# Patient Record
Sex: Male | Born: 1985 | Race: White | Hispanic: No | Marital: Single | Smoking: Former smoker
Health system: Southern US, Community
[De-identification: ages and names within clinical notes are randomized; demographics above are authoritative.]

---

## 2017-06-03 ENCOUNTER — Emergency Department (HOSPITAL_COMMUNITY): Payer: Self-pay

## 2017-06-03 ENCOUNTER — Emergency Department (HOSPITAL_COMMUNITY)
Admission: EM | Admit: 2017-06-03 | Discharge: 2017-06-03 | Disposition: A | Discharge status: Home / Self Care | Payer: Self-pay | Attending: Emergency Medicine | Admitting: Emergency Medicine

## 2017-06-03 ENCOUNTER — Encounter (HOSPITAL_COMMUNITY): Payer: Self-pay | Admitting: Emergency Medicine

## 2017-06-03 DIAGNOSIS — J209 Acute bronchitis, unspecified: Secondary | ICD-10-CM | POA: Insufficient documentation

## 2017-06-03 DIAGNOSIS — F17210 Nicotine dependence, cigarettes, uncomplicated: Secondary | ICD-10-CM | POA: Insufficient documentation

## 2017-06-03 DIAGNOSIS — J40 Bronchitis, not specified as acute or chronic: Secondary | ICD-10-CM

## 2017-06-03 LAB — INFLUENZA PANEL BY PCR (TYPE A & B)
INFLAPCR: NEGATIVE
INFLBPCR: NEGATIVE

## 2017-06-03 MED ORDER — IBUPROFEN 800 MG PO TABS
800.0000 mg | ORAL_TABLET | Freq: Once | ORAL | Status: AC
  Administered 2017-06-03: 800 mg via ORAL
  Filled 2017-06-03: qty 1

## 2017-06-03 MED ORDER — ONDANSETRON 4 MG PO TBDP
4.0000 mg | ORAL_TABLET | Freq: Once | ORAL | Status: AC
  Administered 2017-06-03: 4 mg via ORAL
  Filled 2017-06-03: qty 1

## 2017-06-03 MED ORDER — AZITHROMYCIN 250 MG PO TABS: 250.0000 mg | ORAL_TABLET | Freq: Every day | ORAL | 0 refills | Status: AC

## 2017-06-03 MED ORDER — BENZONATATE 100 MG PO CAPS: 100.0000 mg | ORAL_CAPSULE | Freq: Three times a day (TID) | ORAL | 0 refills | Status: AC

## 2017-06-03 NOTE — ED Provider Notes (Signed)
[facility]-EMERGENCY DEPT Provider Note   CSN: [id number] Arrival date & time: 06/03/17  [id number]     History   Chief Complaint Chief Complaint  Patient presents with  . Cough    HPI Jackson Harrington is a 31 y.o. male who presents to the ED with cough and generalized body aches that started at 3 am. Patient c/o left chest wall pain with coughing so hard. Patient reports chills and sweats, ? Fever. Patient is an every day smoker.   The history is provided by the patient. No language interpreter was used.  Cough  This is a new problem. The current episode started 6 to 12 hours ago. The problem occurs every few minutes. The problem has been gradually worsening. The cough is productive of sputum. Associated symptoms include chills, ear congestion, ear pain, headaches, sore throat, myalgias, shortness of breath and wheezing. Pertinent negatives include no eye redness. Chest pain: with cough. He is a smoker.    History reviewed. No pertinent past medical history.  There are no active problems to display for this patient.   History reviewed. No pertinent surgical history.     Home Medications    Prior to Admission medications   Medication Sig Start Date End Date Taking? Authorizing Provider  azithromycin (ZITHROMAX) 250 MG tablet Take 1 tablet (250 mg total) by mouth daily. Take first 2 tablets together, then 1 every day until finished. 06/03/17   Janne NapoleonNeese, Tichina Koebel M, NP  benzonatate (TESSALON) 100 MG capsule Take 1 capsule (100 mg total) by mouth every 8 (eight) hours. 06/03/17   Janne NapoleonNeese, Tasnia Spegal M, NP    Family History History reviewed. No pertinent family history.  Social History Social History   Tobacco Use  . Smoking status: Current Every Day Smoker  . Smokeless tobacco: Never Used  Substance Use Topics  . Alcohol use: No    Frequency: Never  . Drug use: Not on file     Allergies   Patient has no known allergies.   Review of Systems Review of Systems    Constitutional: Positive for chills. Fever: ?  HENT: Positive for congestion, ear pain and sore throat. Negative for sinus pain.   Eyes: Negative for discharge, redness and itching.  Respiratory: Positive for cough, shortness of breath and wheezing.   Cardiovascular: Chest pain: with cough.  Gastrointestinal: Positive for nausea. Negative for abdominal pain and vomiting.  Genitourinary: Negative for discharge, dysuria, frequency, penile pain and urgency.  Musculoskeletal: Positive for myalgias. Negative for neck pain and neck stiffness.  Skin: Negative for rash.  Neurological: Positive for light-headedness and headaches. Negative for weakness.  Hematological: Negative for adenopathy.  Psychiatric/Behavioral: Negative for confusion. The patient is not nervous/anxious.      Physical Exam Updated Vital Signs BP (!) 142/88 (BP Location: Left Arm)   Pulse 84   Temp 98.6 F (37 C) (Oral)   Resp 16   Ht 6' (1.829 m)   Wt 86.2 kg (190 lb)   SpO2 99%   BMI 25.77 kg/m   Physical Exam  Constitutional: He appears well-developed and well-nourished. No distress.  HENT:  Head: Normocephalic and atraumatic.  Right Ear: Tympanic membrane normal.  Left Ear: Tympanic membrane normal.  Nose: Rhinorrhea present.  Mouth/Throat: Uvula is midline, oropharynx is clear and moist and mucous membranes are normal.  Eyes: EOM are normal.  Neck: Neck supple.  Cardiovascular: Normal rate and regular rhythm.  Pulmonary/Chest: Effort normal. He has no wheezes. He has no rales.  Abdominal: Soft. Bowel sounds are normal. There is no tenderness.  Musculoskeletal: Normal range of motion.  Neurological: He is alert.  Skin: Skin is warm and dry.  Psychiatric: He has a normal mood and affect. His behavior is normal.  Nursing note and vitals reviewed.    ED Treatments / Results  Labs (all labs ordered are listed, but only abnormal results are displayed) Labs Reviewed  INFLUENZA PANEL BY PCR (TYPE A & B)     Radiology Dg Chest 2 View  Result Date: 06/03/2017 CLINICAL DATA:  Generalized chest pain and productive cough. EXAM: CHEST  2 VIEW COMPARISON:  None. FINDINGS: The heart size and mediastinal contours are within normal limits. Both lungs are clear. The visualized skeletal structures are unremarkable. IMPRESSION: Normal chest x-ray. Electronically Signed   By: Obie DredgeWilliam T Derry M.D.   On: 06/03/2017 12:30    Procedures Procedures (including critical care time)  Medications Ordered in ED Medications  ondansetron (ZOFRAN-ODT) disintegrating tablet 4 mg (4 mg Oral Given 06/03/17 1149)  ibuprofen (ADVIL,MOTRIN) tablet 800 mg (800 mg Oral Given 06/03/17 1336)     Initial Impression / Assessment and Plan / ED Course  I have reviewed the triage vital signs and the nursing notes. Pt CXR negative for acute infiltrate. Patients symptoms are consistent with bronchitis. Patient is an every day smoker. Pt will be discharged with treatment for bronchitis. Patient verbalizes understanding and is agreeable with plan. Pt is hemodynamically stable & in NAD prior to dc.  Final Clinical Impressions(s) / ED Diagnoses   Final diagnoses:  Bronchitis    ED Discharge Orders        Ordered    azithromycin (ZITHROMAX) 250 MG tablet  Daily     06/03/17 1341    benzonatate (TESSALON) 100 MG capsule  Every 8 hours     06/03/17 1341       Damian Leavelleese, ElmoreHope M, NP 06/03/17 1345    Linwood DibblesKnapp, Jon, MD 06/04/17 1238

## 2017-06-03 NOTE — ED Triage Notes (Signed)
Pt reports he woke up at 0300 with lots of coughing, generalized body aches, and cold chills. Pt reports his chest hurts with coughing

## 2017-06-03 NOTE — Discharge Instructions (Signed)
Take the medication as directed. Be sure you are drinking plenty of fluids. Follow up with your primary care provider. Return here as needed.

## 2017-07-13 ENCOUNTER — Encounter (HOSPITAL_COMMUNITY): Payer: Self-pay | Admitting: *Deleted

## 2017-07-13 ENCOUNTER — Other Ambulatory Visit: Payer: Self-pay

## 2017-07-13 ENCOUNTER — Emergency Department (HOSPITAL_COMMUNITY)
Admission: EM | Admit: 2017-07-13 | Discharge: 2017-07-13 | Disposition: A | Discharge status: Home / Self Care | Payer: Self-pay | Attending: Emergency Medicine | Admitting: Emergency Medicine

## 2017-07-13 DIAGNOSIS — R1110 Vomiting, unspecified: Secondary | ICD-10-CM | POA: Insufficient documentation

## 2017-07-13 DIAGNOSIS — R109 Unspecified abdominal pain: Secondary | ICD-10-CM | POA: Insufficient documentation

## 2017-07-13 DIAGNOSIS — Z5321 Procedure and treatment not carried out due to patient leaving prior to being seen by health care provider: Secondary | ICD-10-CM | POA: Insufficient documentation

## 2017-07-13 LAB — LIPASE, BLOOD: Lipase: 70 U/L — ABNORMAL HIGH (ref 11–51)

## 2017-07-13 LAB — COMPREHENSIVE METABOLIC PANEL
ALT: 80 U/L — AB (ref 17–63)
AST: 52 U/L — AB (ref 15–41)
Albumin: 4 g/dL (ref 3.5–5.0)
Alkaline Phosphatase: 48 U/L (ref 38–126)
Anion gap: 12 (ref 5–15)
BILIRUBIN TOTAL: 0.9 mg/dL (ref 0.3–1.2)
BUN: 17 mg/dL (ref 6–20)
CALCIUM: 8.8 mg/dL — AB (ref 8.9–10.3)
CO2: 22 mmol/L (ref 22–32)
CREATININE: 0.95 mg/dL (ref 0.61–1.24)
Chloride: 102 mmol/L (ref 101–111)
Glucose, Bld: 111 mg/dL — ABNORMAL HIGH (ref 65–99)
Potassium: 4.3 mmol/L (ref 3.5–5.1)
Sodium: 136 mmol/L (ref 135–145)
TOTAL PROTEIN: 7 g/dL (ref 6.5–8.1)

## 2017-07-13 LAB — CBC
HCT: 46.8 % (ref 39.0–52.0)
Hemoglobin: 16.3 g/dL (ref 13.0–17.0)
MCH: 29.9 pg (ref 26.0–34.0)
MCHC: 34.8 g/dL (ref 30.0–36.0)
MCV: 85.9 fL (ref 78.0–100.0)
PLATELETS: 193 10*3/uL (ref 150–400)
RBC: 5.45 MIL/uL (ref 4.22–5.81)
RDW: 13.2 % (ref 11.5–15.5)
WBC: 7.5 10*3/uL (ref 4.0–10.5)

## 2017-07-13 MED ORDER — ONDANSETRON 4 MG PO TBDP
4.0000 mg | ORAL_TABLET | Freq: Once | ORAL | Status: AC | PRN
  Administered 2017-07-13: 4 mg via ORAL
  Filled 2017-07-13: qty 1

## 2017-07-13 NOTE — ED Notes (Signed)
Pt called for room no answer

## 2017-07-13 NOTE — ED Triage Notes (Signed)
Pt reports waking up this am with bodyaches and n/v. Denies diarrhea.

## 2017-07-13 NOTE — ED Notes (Signed)
Patient called no answer.

## 2020-08-26 ENCOUNTER — Emergency Department (HOSPITAL_BASED_OUTPATIENT_CLINIC_OR_DEPARTMENT_OTHER)
Admission: EM | Admit: 2020-08-26 | Discharge: 2020-08-26 | Disposition: A | Discharge status: Home / Self Care | Payer: Worker's Compensation | Attending: Emergency Medicine | Admitting: Emergency Medicine

## 2020-08-26 ENCOUNTER — Other Ambulatory Visit: Payer: Self-pay

## 2020-08-26 ENCOUNTER — Emergency Department (HOSPITAL_BASED_OUTPATIENT_CLINIC_OR_DEPARTMENT_OTHER): Payer: Worker's Compensation

## 2020-08-26 ENCOUNTER — Encounter (HOSPITAL_BASED_OUTPATIENT_CLINIC_OR_DEPARTMENT_OTHER): Payer: Self-pay | Admitting: *Deleted

## 2020-08-26 DIAGNOSIS — W230XXA Caught, crushed, jammed, or pinched between moving objects, initial encounter: Secondary | ICD-10-CM | POA: Diagnosis not present

## 2020-08-26 DIAGNOSIS — Y990 Civilian activity done for income or pay: Secondary | ICD-10-CM | POA: Insufficient documentation

## 2020-08-26 DIAGNOSIS — Z87891 Personal history of nicotine dependence: Secondary | ICD-10-CM | POA: Insufficient documentation

## 2020-08-26 DIAGNOSIS — M25531 Pain in right wrist: Secondary | ICD-10-CM | POA: Diagnosis present

## 2020-08-26 NOTE — ED Provider Notes (Signed)
[facility] EMERGENCY DEPARTMENT Provider Note   CSN: [id number] Arrival date & time: 08/26/20  1701     History Chief Complaint  Patient presents with  . Wrist Injury    Jackson Harrington is a 35 y.o. male.  HPI   35 year old male presenting to the emergency department today for evaluation of right wrist pain.  States he was carrying a couch with one of his coworkers when his arm became lodged between the couch and a door jam.  He now has pain and swelling to the right wrist.  He reports a tingling sensation to his right hand as well.  He took some ibuprofen prior to arrival.  He denies any other injuries.  History reviewed. No pertinent past medical history.  There are no problems to display for this patient.   History reviewed. No pertinent surgical history.     No family history on file.  Social History   Tobacco Use  . Smoking status: Former Games developer  . Smokeless tobacco: Never Used  Substance Use Topics  . Alcohol use: Yes  . Drug use: Never    Home Medications Prior to Admission medications   Medication Sig Start Date End Date Taking? Authorizing Provider  azithromycin (ZITHROMAX) 250 MG tablet Take 1 tablet (250 mg total) by mouth daily. Take first 2 tablets together, then 1 every day until finished. 06/03/17   Janne Napoleon, NP  benzonatate (TESSALON) 100 MG capsule Take 1 capsule (100 mg total) by mouth every 8 (eight) hours. 06/03/17   Janne Napoleon, NP    Allergies    Patient has no known allergies.  Review of Systems   Review of Systems  Musculoskeletal:       Right wrist pain  Skin: Negative for wound.  Neurological: Negative for seizures.       Paresthesias/numbness to right hand  All other systems reviewed and are negative.   Physical Exam Updated Vital Signs BP (!) 156/104 (BP Location: Left Arm)   Pulse 78   Temp 97.6 F (36.4 C) (Oral)   Resp 20   Ht 6' (1.829 m)   Wt 108.9 kg   SpO2 100%   BMI 32.55 kg/m   Physical  Exam Constitutional:      General: He is not in acute distress.    Appearance: He is well-developed and well-nourished.  Eyes:     Conjunctiva/sclera: Conjunctivae normal.  Cardiovascular:     Rate and Rhythm: Normal rate.  Pulmonary:     Effort: Pulmonary effort is normal.  Musculoskeletal:     Comments: TTP over the distal radius/ulna with swelling noted. Radial/ulnar pulses are intact. Decreased ROM/strength to the hand/wrist 2/2 pain.   Skin:    General: Skin is warm and dry.  Neurological:     Mental Status: He is alert and oriented to person, place, and time.     ED Results / Procedures / Treatments   Labs (all labs ordered are listed, but only abnormal results are displayed) Labs Reviewed - No data to display  EKG None  Radiology DG Wrist Complete Right  Result Date: 08/26/2020 CLINICAL DATA:  Wrist pain after injury EXAM: RIGHT WRIST - COMPLETE 3+ VIEW COMPARISON:  None. FINDINGS: There is no evidence of fracture or dislocation. There is no evidence of arthropathy or other focal bone abnormality. Soft tissues are unremarkable. IMPRESSION: Negative. Electronically Signed   By: Jasmine Pang M.D.   On: 08/26/2020 17:41    Procedures Procedures  Medications Ordered in ED Medications - No data to display  ED Course  I have reviewed the triage vital signs and the nursing notes.  Pertinent labs & imaging results that were available during my care of the patient were reviewed by me and considered in my medical decision making (see chart for details).    MDM Rules/Calculators/A&P                          35 y/o M presenting for eval of right wrist pain. NVI on exam. Xray right wrist reviewed/interpreted - neg for acute traumatic injury. Will place in wrist splint for comfort. Will give ortho f/u should his sxs persist. Advised otc pain meds, rice protocol. He voices understanding of the plan and reasons to return. All questions answered, pt stable for discharge.    Final Clinical Impression(s) / ED Diagnoses Final diagnoses:  Right wrist pain    Rx / DC Orders ED Discharge Orders    None       Karrie Meres, PA-C 08/26/20 1850    Rolan Bucco, MD 08/26/20 2304

## 2020-08-26 NOTE — Discharge Instructions (Addendum)

## 2020-08-26 NOTE — ED Triage Notes (Signed)
Right wrist injury at work today. Crushed between a couch and door jam. Workman's comp. Ice applied at triage.

## 2020-08-31 ENCOUNTER — Ambulatory Visit (INDEPENDENT_AMBULATORY_CARE_PROVIDER_SITE_OTHER): Payer: Worker's Compensation | Admitting: Family Medicine

## 2020-08-31 ENCOUNTER — Other Ambulatory Visit: Payer: Self-pay

## 2020-08-31 ENCOUNTER — Ambulatory Visit: Payer: Self-pay

## 2020-08-31 VITALS — BP 120/80 | Ht 72.0 in | Wt 240.0 lb

## 2020-08-31 DIAGNOSIS — S638X1A Sprain of other part of right wrist and hand, initial encounter: Secondary | ICD-10-CM | POA: Insufficient documentation

## 2020-08-31 DIAGNOSIS — S63501A Unspecified sprain of right wrist, initial encounter: Secondary | ICD-10-CM | POA: Diagnosis not present

## 2020-08-31 DIAGNOSIS — S63501D Unspecified sprain of right wrist, subsequent encounter: Secondary | ICD-10-CM | POA: Insufficient documentation

## 2020-08-31 DIAGNOSIS — M25531 Pain in right wrist: Secondary | ICD-10-CM

## 2020-08-31 MED ORDER — PREDNISONE 5 MG PO TABS: ORAL_TABLET | ORAL | 0 refills | Status: AC

## 2020-08-31 NOTE — Assessment & Plan Note (Signed)
Injury occurred on 2/18 while at work.  Seems more like a sprain.  Seems less likely for compartment syndrome but does have significant pain to palpation on exam.  Less likely for rhabdomyolysis -Counseled on supportive care. -Provided splint today. -Prednisone.  -Provided work note note

## 2020-08-31 NOTE — Patient Instructions (Signed)
Nice to meet you Please try the splint  Please let me know if your symptoms get worse   Please send me a message in MyChart with any questions or updates.  Please see me back in 2 weeks.   --Dr. Jordan Likes

## 2020-08-31 NOTE — Progress Notes (Signed)
  Jackson Harrington - 35 y.o. male MRN [id number]  Date of birth: [birth year]  SUBJECTIVE:  Including CC & ROS.  No chief complaint on file.   Jackson Harrington is a 35 y.o. male that is presenting with right wrist and forearm pain following an injury sustained at work.  He was helping carry a couch and his arm was pinned in the doorway.  Having significant pain over the wrist dorsally as well as the forearm dorsally.  Denies any history of similar pain or surgery.  Symptoms seem to be getting worse over the past 2 days.  Has been taken ibuprofen with limited improvement.  Having weakness in his grip..  Independent review of the right wrist x-ray from 2/18 shows no acute changes.   Review of Systems See HPI   HISTORY: Past Medical, Surgical, Social, and Family History Reviewed & Updated per EMR.   Pertinent Historical Findings include:  No past medical history on file.  No past surgical history on file.  No family history on file.  Social History   Socioeconomic History  . Marital status: Single    Spouse name: Not on file  . Number of children: Not on file  . Years of education: Not on file  . Highest education level: Not on file  Occupational History  . Not on file  Tobacco Use  . Smoking status: Former Games developer  . Smokeless tobacco: Never Used  Substance and Sexual Activity  . Alcohol use: Yes  . Drug use: Never  . Sexual activity: Not on file  Other Topics Concern  . Not on file  Social History Narrative  . Not on file   Social Determinants of Health   Financial Resource Strain: Not on file  Food Insecurity: Not on file  Transportation Needs: Not on file  Physical Activity: Not on file  Stress: Not on file  Social Connections: Not on file  Intimate Partner Violence: Not on file     PHYSICAL EXAM:  VS: BP 120/80   Ht 6' (1.829 m)   Wt 240 lb (108.9 kg)   BMI 32.55 kg/m  Physical Exam Gen: NAD, alert, cooperative with exam, well-appearing MSK:  Right arm: No  swelling or ecchymosis. Normal elbow range of motion. Normal wrist range of motion. Tenderness to palpation over the distal radius. Exacerbation of symptoms with range of motion of his wrist. Weakness with grip strength. Neurovascularly intact  Limited ultrasound: Right wrist and arm:  No effusion noted in the carpal joints. No change of the distal radius. No swelling of the forearm or hematoma appreciated. Normal-appearing radial nerve at the antecubital fossa.   Summary: No structural changes appreciated.  Ultrasound and interpretation by Clare Gandy, MD  1. Forearm and wrist   2. Right 3. Volar short arm 4. Ortho-glass 5. Applied by Dr. Jordan Likes    ASSESSMENT & PLAN:   Wrist sprain, right, initial encounter Injury occurred on 2/18 while at work.  Seems more like a sprain.  Seems less likely for compartment syndrome but does have significant pain to palpation on exam.  Less likely for rhabdomyolysis -Counseled on supportive care. -Provided splint today. -Prednisone.  -Provided work note note

## 2020-09-14 ENCOUNTER — Other Ambulatory Visit: Payer: Self-pay

## 2020-09-14 ENCOUNTER — Ambulatory Visit (INDEPENDENT_AMBULATORY_CARE_PROVIDER_SITE_OTHER): Payer: Worker's Compensation | Admitting: Family Medicine

## 2020-09-14 ENCOUNTER — Encounter: Payer: Self-pay | Admitting: Family Medicine

## 2020-09-14 DIAGNOSIS — S63501D Unspecified sprain of right wrist, subsequent encounter: Secondary | ICD-10-CM

## 2020-09-14 NOTE — Patient Instructions (Signed)
Good to see you You can use the black brace for you wrist  Please try the occupational therapy   Please send me a message in MyChart with any questions or updates.  Please see me back in 2 weeks.   --Dr. Jordan Likes

## 2020-09-14 NOTE — Assessment & Plan Note (Addendum)
Has improvement in pain and function. Pain still limits his motion and function.  Seems to be nerve related.  No specific area of tenderness diffusely from the wrist and proximally. -Counseled on home exercise therapy and supportive care. -Can use the wrist brace. -Referral to Occupational Therapy. -Provided work note

## 2020-09-14 NOTE — Progress Notes (Signed)
  Jackson Harrington - 35 y.o. male MRN [id number]  Date of birth: 05/07/1986  SUBJECTIVE:  Including CC & ROS.  No chief complaint on file.   Jackson Harrington is a 35 y.o. male that is following up regards to his right wrist and arm pain after injury at work.  He did get improvement with volar splint.  Still has pain that shoots up his arm towards his elbow.  Still has pain with certain range of motion movements.   Review of Systems See HPI   HISTORY: Past Medical, Surgical, Social, and Family History Reviewed & Updated per EMR.   Pertinent Historical Findings include:  No past medical history on file.  No past surgical history on file.  No family history on file.  Social History   Socioeconomic History  . Marital status: Single    Spouse name: Not on file  . Number of children: Not on file  . Years of education: Not on file  . Highest education level: Not on file  Occupational History  . Not on file  Tobacco Use  . Smoking status: Former Games developer  . Smokeless tobacco: Never Used  Substance and Sexual Activity  . Alcohol use: Yes  . Drug use: Never  . Sexual activity: Not on file  Other Topics Concern  . Not on file  Social History Narrative  . Not on file   Social Determinants of Health   Financial Resource Strain: Not on file  Food Insecurity: Not on file  Transportation Needs: Not on file  Physical Activity: Not on file  Stress: Not on file  Social Connections: Not on file  Intimate Partner Violence: Not on file     PHYSICAL EXAM:  VS: BP (!) 142/100 (BP Location: Left Arm, Patient Position: Sitting, Cuff Size: Large)   Ht 6' (1.829 m)   Wt 240 lb (108.9 kg)   BMI 32.55 kg/m  Physical Exam Gen: NAD, alert, cooperative with exam, well-appearing MSK:  Right hand Has ability to flex and extend the wrist but causes pain. Normal thumb extension. Normal pincer grasp. Normal elbow range of motion. No swelling or ecchymosis. Positive Tinel's at the cubital  tunnel. Neurovascular intact     ASSESSMENT & PLAN:   Wrist sprain, right, subsequent encounter Has improvement in pain and function. Pain still limits his motion and function.  Seems to be nerve related.  No specific area of tenderness diffusely from the wrist and proximally. -Counseled on home exercise therapy and supportive care. -Can use the wrist brace. -Referral to Occupational Therapy. -Provided work note

## 2020-09-30 ENCOUNTER — Other Ambulatory Visit: Payer: Self-pay

## 2020-09-30 ENCOUNTER — Ambulatory Visit (INDEPENDENT_AMBULATORY_CARE_PROVIDER_SITE_OTHER): Payer: Worker's Compensation | Admitting: Family Medicine

## 2020-09-30 DIAGNOSIS — S63501D Unspecified sprain of right wrist, subsequent encounter: Secondary | ICD-10-CM

## 2020-09-30 MED ORDER — GABAPENTIN 100 MG PO CAPS: 100.0000 mg | ORAL_CAPSULE | Freq: Three times a day (TID) | ORAL | 1 refills | Status: AC

## 2020-09-30 NOTE — Progress Notes (Signed)
°  Jackson Harrington - 35 y.o. male MRN [id number]  Date of birth: 04/01/1986  SUBJECTIVE:  Including CC & ROS.  No chief complaint on file.   Jackson Harrington is a 35 y.o. male that is following up for his ongoing right wrist pain following an injury sustained at work.  He has been going through physical therapy with limited improvement.  He still has significant loss of motion function and grip strength.  Review of Systems See HPI   HISTORY: Past Medical, Surgical, Social, and Family History Reviewed & Updated per EMR.   Pertinent Historical Findings include:  No past medical history on file.  No past surgical history on file.  No family history on file.  Social History   Socioeconomic History   Marital status: Single    Spouse name: Not on file   Number of children: Not on file   Years of education: Not on file   Highest education level: Not on file  Occupational History   Not on file  Tobacco Use   Smoking status: Former Smoker   Smokeless tobacco: Never Used  Substance and Sexual Activity   Alcohol use: Yes   Drug use: Never   Sexual activity: Not on file  Other Topics Concern   Not on file  Social History Narrative   Not on file   Social Determinants of Health   Financial Resource Strain: Not on file  Food Insecurity: Not on file  Transportation Needs: Not on file  Physical Activity: Not on file  Stress: Not on file  Social Connections: Not on file  Intimate Partner Violence: Not on file     PHYSICAL EXAM:  VS: BP (!) 146/98 (BP Location: Right Arm, Patient Position: Sitting, Cuff Size: Large)    Ht 6' (1.829 m)    Wt 240 lb (108.9 kg)    BMI 32.55 kg/m  Physical Exam Gen: NAD, alert, cooperative with exam, well-appearing MSK:  Right wrist: Limited range of motion in flexion and extension. Pain with ulnar deviation. Limited grip strength. Neurovascular intact     ASSESSMENT & PLAN:   Wrist sprain, right, subsequent encounter Limited  improvement after injury at work.  Has been going through physical therapy.  Still has limitations in his range of motion and experiences culture sensation in the palmar aspect of his hand.  Concerned there may be a TFCC tear as to why his function and strength have been compromised. -Counseled on home exercise therapy and supportive care. -Initiate gabapentin to see if he gets improvement with altered sensation and pain in the palmar aspect of his hand. -MRI to evaluate for TFCC tear.  -Follow-up in 2 weeks. -Provided work note.

## 2020-09-30 NOTE — Patient Instructions (Signed)
Good to see you Please put a hold on physical therapy   Please send me a message in MyChart with any questions or updates.  We will schedule a virtual visit once the MRI is resulted.   --Dr. Jordan Likes

## 2020-09-30 NOTE — Assessment & Plan Note (Addendum)
Limited improvement after injury at work.  Has been going through physical therapy.  Still has limitations in his range of motion and experiences culture sensation in the palmar aspect of his hand.  Concerned there may be a TFCC tear as to why his function and strength have been compromised. -Counseled on home exercise therapy and supportive care. -Initiate gabapentin to see if he gets improvement with altered sensation and pain in the palmar aspect of his hand. -MRI to evaluate for TFCC tear.  -Follow-up in 2 weeks. -Provided work note.

## 2020-10-29 ENCOUNTER — Ambulatory Visit (HOSPITAL_BASED_OUTPATIENT_CLINIC_OR_DEPARTMENT_OTHER): Payer: Self-pay

## 2020-10-31 ENCOUNTER — Other Ambulatory Visit: Payer: Self-pay

## 2020-10-31 ENCOUNTER — Ambulatory Visit (INDEPENDENT_AMBULATORY_CARE_PROVIDER_SITE_OTHER): Payer: Worker's Compensation | Admitting: Sports Medicine

## 2020-10-31 ENCOUNTER — Ambulatory Visit (INDEPENDENT_AMBULATORY_CARE_PROVIDER_SITE_OTHER): Payer: Worker's Compensation

## 2020-10-31 DIAGNOSIS — S63501D Unspecified sprain of right wrist, subsequent encounter: Secondary | ICD-10-CM | POA: Diagnosis not present

## 2020-10-31 MED ORDER — GADOBUTROL 1 MMOL/ML IV SOLN
1.0000 mL | Freq: Once | INTRAVENOUS | Status: AC | PRN
  Administered 2020-10-31: 1 mL via INTRAVENOUS

## 2020-10-31 NOTE — Progress Notes (Signed)
    Procedures performed today:    Procedure: Real-time Ultrasound Guided gadolinium contrast injection of right radiocarpal joint Device: Samsung HS60  Verbal informed consent obtained.  Time-out conducted.  Noted no overlying erythema, induration, or other signs of local infection.  Skin prepped in a sterile fashion.  Local anesthesia: Topical Ethyl chloride.  With sterile technique and under real time ultrasound guidance: No noted effusion, using a 25-gauge needle advanced into the radiocarpal joint, I then injected 1 cc kenalog 40, 1 cc lidocaine, syringe switched and 0.05 cc gadolinium injected, syringe and switched and the joint distended with sterile saline. Joint visualized and capsule seen distending confirming intra-articular placement of contrast material and medication. Completed without difficulty  Advised to call if fevers/chills, erythema, induration, drainage, or persistent bleeding.  Images permanently stored in PACS Impression: Technically successful ultrasound guided gadolinium contrast injection for MR arthrography.  Please see separate MR arthrogram report.   Independent interpretation of notes and tests performed by another provider:   None.  Brief History, Exam, Impression, and Recommendations:    Wrist sprain, right, subsequent encounter Patient is referred to me for MR arthrography, today we did the contrast injection into his radiocarpal joint. He will be sent down to the MRI scanner, further management per primary treating provider.    ___________________________________________ Ihor Austin. Benjamin Stain, M.D., ABFM., CAQSM. Primary Care and Sports Medicine [facility]  Adjunct Instructor of Family Medicine  [employer]

## 2020-10-31 NOTE — Assessment & Plan Note (Signed)
Patient is referred to me for MR arthrography, today we did the contrast injection into his radiocarpal joint. He will be sent down to the MRI scanner, further management per primary treating provider.

## 2020-11-02 ENCOUNTER — Ambulatory Visit (INDEPENDENT_AMBULATORY_CARE_PROVIDER_SITE_OTHER): Payer: Worker's Compensation | Admitting: Family Medicine

## 2020-11-02 ENCOUNTER — Encounter: Payer: Self-pay | Admitting: Family Medicine

## 2020-11-02 ENCOUNTER — Other Ambulatory Visit: Payer: Self-pay

## 2020-11-02 DIAGNOSIS — S638X1D Sprain of other part of right wrist and hand, subsequent encounter: Secondary | ICD-10-CM | POA: Diagnosis not present

## 2020-11-02 NOTE — Assessment & Plan Note (Addendum)
Injury occurred at work.  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 -Counseled on supportive care. -Referral to orthopedic surgery and specialist.

## 2020-11-02 NOTE — Progress Notes (Signed)
  Jackson Harrington - 35 y.o. male MRN [id number]  Date of birth: 1985/08/03  SUBJECTIVE:  Including CC & ROS.  No chief complaint on file.   Jackson Harrington is a 35 y.o. male that is here following up for his right wrist pain.  MRI was revealing for a full-thickness tear of the scapholunate ligament.  His injury started at work.   Review of Systems See HPI   HISTORY: Past Medical, Surgical, Social, and Family History Reviewed & Updated per EMR.   Pertinent Historical Findings include:  History reviewed. No pertinent past medical history.  History reviewed. No pertinent surgical history.  History reviewed. No pertinent family history.  Social History   Socioeconomic History  . Marital status: Single    Spouse name: Not on file  . Number of children: Not on file  . Years of education: Not on file  . Highest education level: Not on file  Occupational History  . Not on file  Tobacco Use  . Smoking status: Former Games developer  . Smokeless tobacco: Never Used  Substance and Sexual Activity  . Alcohol use: Yes  . Drug use: Never  . Sexual activity: Not on file  Other Topics Concern  . Not on file  Social History Narrative  . Not on file   Social Determinants of Health   Financial Resource Strain: Not on file  Food Insecurity: Not on file  Transportation Needs: Not on file  Physical Activity: Not on file  Stress: Not on file  Social Connections: Not on file  Intimate Partner Violence: Not on file     PHYSICAL EXAM:  VS: BP (!) 154/108 (BP Location: Left Arm, Patient Position: Sitting, Cuff Size: Large)   Ht 6' (1.829 m)   Wt 250 lb (113.4 kg)   BMI 33.91 kg/m  Physical Exam Gen: NAD, alert, cooperative with exam, well-appearing   ASSESSMENT & PLAN:   Tear of right scapholunate ligament Injury occurred at work.  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 -Counseled on supportive care. -Referral to orthopedic surgery and specialist.

## 2020-11-02 NOTE — Patient Instructions (Signed)
Good to see you We will refer you to the surgeon   Please send me a message in MyChart with any questions or updates.  Please see [place] back as needed.   --Dr. Jordan Likes

## 2022-10-25 ENCOUNTER — Encounter: Payer: Self-pay | Admitting: *Deleted

## 2023-04-14 IMAGING — MR MR WRIST*R* W/ CM
6 series · 40 of 40 positions shown · IV contrast (agent unspecified)
Comparison: Right wrist x-rays dated August 26, 2020.

CONTRAST:  1mL GADAVIST GADOBUTROL 1 MMOL/ML IV SOLN

CLINICAL DATA: Right wrist pain since dropping a couch on it 2
months ago.

EXAM:
MR OF THE RIGHT WRIST WITH CONTRAST (MR ARTHROGRAM)
TECHNIQUE: Multiplanar, multisequence MR imaging of the right wrist was
performed following the administration of intra-articular contrast.

[Series 4: T1 fat-sat · axial · 3.0mm · 0.38mm/px · z∈[-46,+22]mm · 7 of 22 slices shown (1 of 3)]
[im 1/22]
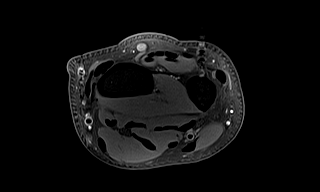
[im 4/22]
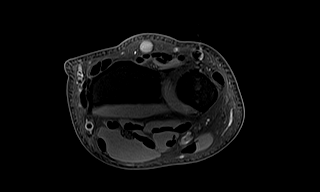
[im 8/22]
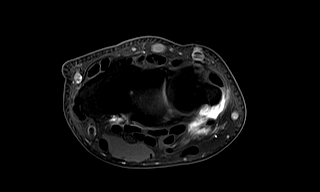
[im 11/22]
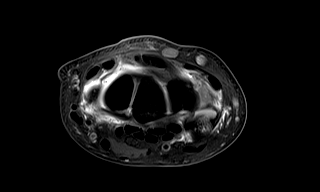
[im 15/22]
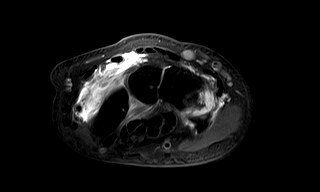
[im 18/22]
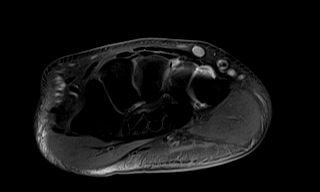
[im 22/22]
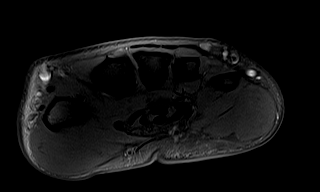

[Series 5: T2 fat-sat · axial · 3.0mm · 0.39mm/px · z∈[-44,+24]mm · 8 of 22 slices shown (1 of 2)]
[im 1/22]
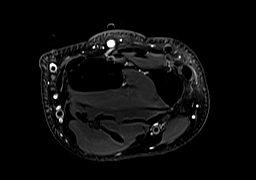
[im 4/22]
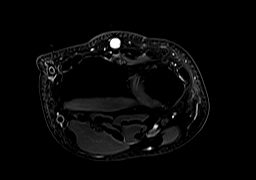
[im 7/22]
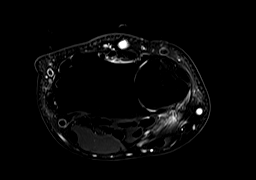
[im 10/22]
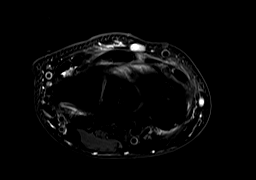
[im 13/22]
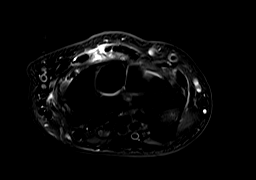
[im 16/22]
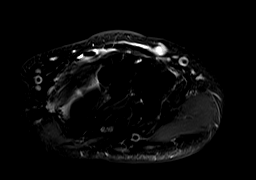
[im 19/22]
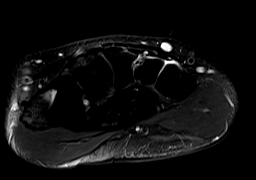
[im 22/22]
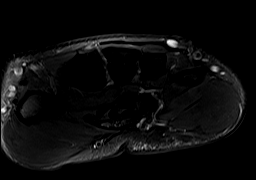

[Series 6: T1 fat-sat · coronal · 3.0mm · 0.39mm/px · 6 of 17 slices shown (2 of 3)]
[im 1/17]
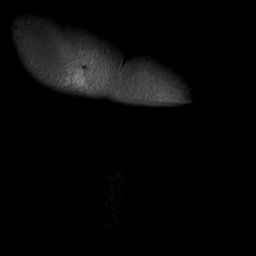
[im 4/17]
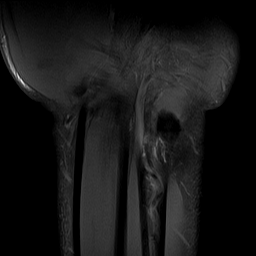
[im 7/17]
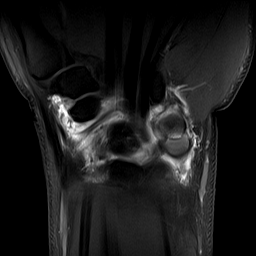
[im 10/17]
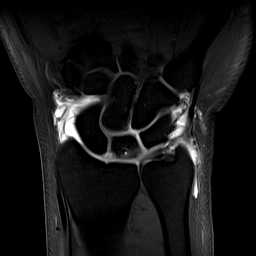
[im 13/17]
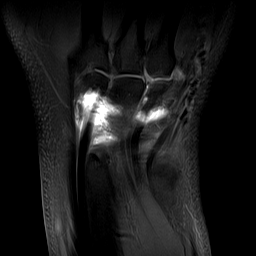
[im 17/17]
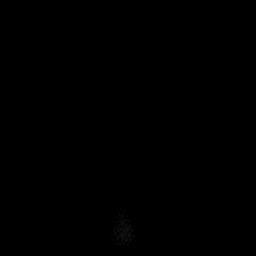

[Series 7: T1 · coronal · 3.0mm · 0.39mm/px · 6 of 17 slices shown]
[im 1/17]
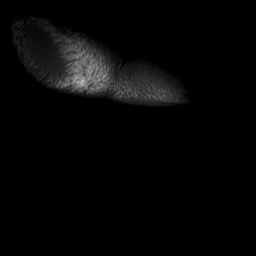
[im 4/17]
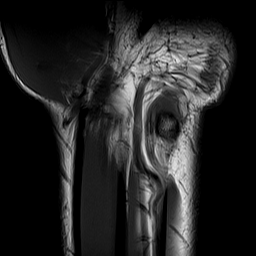
[im 7/17]
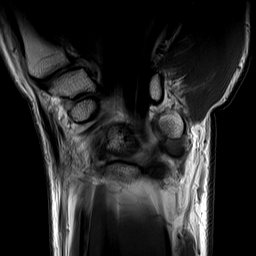
[im 10/17]
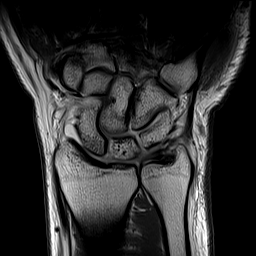
[im 13/17]
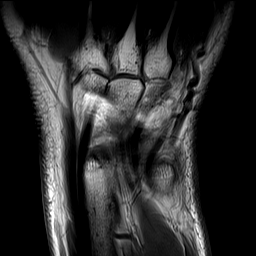
[im 17/17]
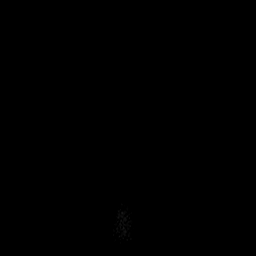

[Series 8: T2 fat-sat · coronal · 3.0mm · 0.39mm/px · 6 of 17 slices shown (2 of 2)]
[im 1/17]
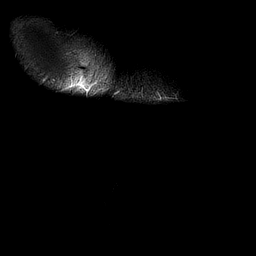
[im 4/17]
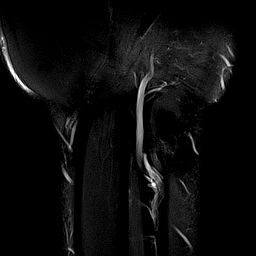
[im 7/17]
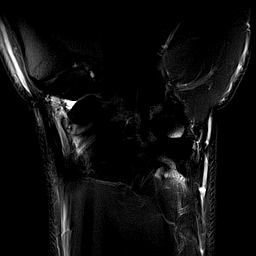
[im 10/17]
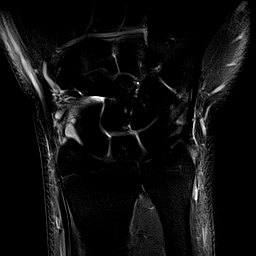
[im 13/17]
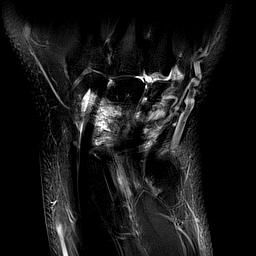
[im 17/17]
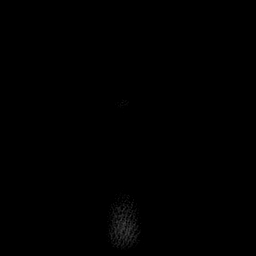

[Series 9: T1 fat-sat · sagittal · 3.0mm · 0.39mm/px · 7 of 21 slices shown (3 of 3)]
[im 1/21]
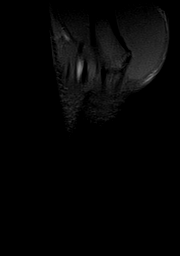
[im 4/21]
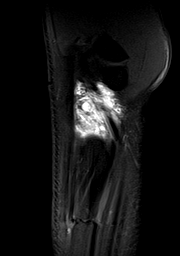
[im 7/21]
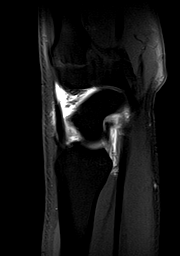
[im 11/21]
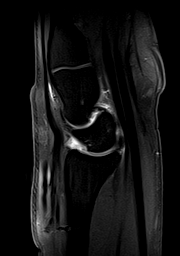
[im 14/21]
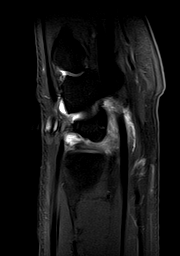
[im 17/21]
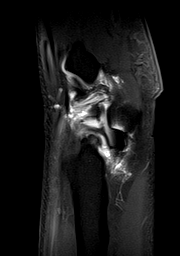
[im 21/21]
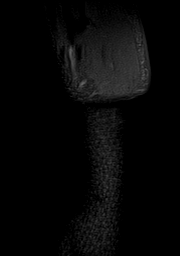

[40 of 40 positions shown; findings below may reference images not displayed]

FINDINGS: Ligaments: Suspected small full-thickness tear of the scapholunate
ligament interosseous component with contrast extending into the
midcarpal joint (series 6, images 9-10). Intact lunotriquetral
ligament.

Triangular fibrocartilage: Intact TFCC.

Tendons: Intact flexor and extensor compartment tendons.

Carpal tunnel/median nerve: Normal carpal tunnel. Normal median
nerve.

Guyon's canal: Normal.

Joint/cartilage: No chondral defect.

Bones/carpal alignment: No marrow signal abnormality. Normal
alignment. No aggressive osseous lesion.

Other: No fluid collection or hematoma.
IMPRESSION: 1. Suspected small full-thickness tear of the scapholunate ligament
interosseous component with contrast extending into the midcarpal
joint.
2. Intact TFCC.
